# Patient Record
Sex: Female | Born: 1970 | Hispanic: Yes | Marital: Married | Smoking: Never smoker
Health system: Southern US, Community
[De-identification: ages and names within clinical notes are randomized; demographics above are authoritative.]

---

## 2017-08-16 ENCOUNTER — Other Ambulatory Visit: Payer: Self-pay

## 2017-08-16 ENCOUNTER — Ambulatory Visit (INDEPENDENT_AMBULATORY_CARE_PROVIDER_SITE_OTHER): Payer: PRIVATE HEALTH INSURANCE | Admitting: Osteopathic Medicine

## 2017-08-16 ENCOUNTER — Encounter: Payer: Self-pay | Admitting: Osteopathic Medicine

## 2017-08-16 VITALS — BP 120/68 | HR 60 | Temp 97.8°F | Resp 16 | Ht 64.0 in | Wt 204.4 lb

## 2017-08-16 DIAGNOSIS — Z0000 Encounter for general adult medical examination without abnormal findings: Secondary | ICD-10-CM | POA: Diagnosis not present

## 2017-08-16 NOTE — Progress Notes (Signed)
HPI: Teresa Patton is a 46 y.o. female with has no past medical history on file.  who presents to Primary Care at [facility] today, 08/16/17,  for chief complaint of:  Chief Complaint  Patient presents with  . Annual Exam    Pap 1 year ago  See below for review of preventive care  G4P4, no GHTN or GDM  Patient is accompanied by husband who assists with history-taking.   [place] utilized to assist translation: ID# [id number]   Past medical, surgical, social and family history reviewed:  There are no active problems to display for this patient.   History reviewed. No pertinent surgical history.  Social History   Tobacco Use  . Smoking status: Never Smoker  . Smokeless tobacco: Never Used  Substance Use Topics  . Alcohol use: Not on file    Family History  Problem Relation Age of Onset  . Cancer Mother   . Hypertension Father      Current medication list and allergy/intolerance information reviewed:    No current outpatient medications on file.   No current facility-administered medications for this visit.     No Known Allergies    Review of Systems:  Constitutional:  No  fever, no chills, No recent illness  HEENT: No  headache, no vision change,  Cardiac: No  chest pain  Respiratory:  No  shortness of breath.  Gastrointestinal: No  abdominal pain  Musculoskeletal: No new myalgia/arthralgia  Skin: No  Rash  Hem/Onc: No  easy bruising/bleedin  Neurologic: No  weakness, No  dizziness,   Psychiatric: No  concerns with depression, No  concerns with anxiety  Exam:  BP 120/68   Pulse 60   Temp 97.8 F (36.6 C)   Resp 16   Ht _0  (1.626 m)   Wt 204 lb 6.4 oz (92.7 kg)   LMP 07/27/2017   SpO2 99%   BMI 35.09 kg/m   Constitutional: VS see above. General Appearance: alert, well-developed, well-nourished, NAD  Eyes: Normal lids and conjunctive, non-icteric sclera  Ears, Nose, Mouth, Throat: MMM, Normal external inspection  ears/nares/mouth/lips/gums. TM normal bilaterally. Pharynx/tonsils no erythema, no exudate. Nasal mucosa normal.   Neck: No masses, trachea midline. No thyroid enlargement. No tenderness/mass appreciated. No lymphadenopathy  Respiratory: Normal respiratory effort. no wheeze, no rhonchi, no rales  Cardiovascular: S1/S2 normal, no murmur, no rub/gallop auscultated. RRR. No lower extremity edema.   Gastrointestinal: Nontender, no masses. No hepatomegaly, no splenomegaly. No hernia appreciated. Bowel sounds normal. Rectal exam deferred.   Musculoskeletal: Gait normal. No clubbing/cyanosis of digits.   Neurological: Normal balance/coordination. No tremor.   Skin: warm, dry, intact.   Psychiatric: Normal judgment/insight. Normal mood and affect. Oriented x3.  GYN: No lesions/ulcers to external genitalia, normal urethra, normal vaginal mucosa, physiologic discharge, cervix normal without lesions, uterus not enlarged or tender, adnexa no masses and nontender  BREAST: No rashes/skin changes, normal fibrous breast tissue, no masses or tenderness, normal nipple without discharge, normal axilla    ASSESSMENT/PLAN:   Annual physical exam - Plan: CBC, CMP14+EGFR, Lipid panel, Pap IG (Image Guided), MM DIGITAL SCREENING BILATERAL, CANCELED: CBC, CANCELED: CMP14+EGFR, CANCELED: Lipid panel  Routine general medical examination at a health care facility   [place] Updated 08/16/17   ANNUAL SCREENING/COUNSELING  Diet/Exercise - HEALTHY HABITS DISCUSSED TO DECREASE CV RISK Social History   Tobacco Use  Smoking Status Never Smoker  Smokeless Tobacco Never Used    Social History   Substance and Sexual Activity  Alcohol Use Not on file    Depression screen [facility] 2/9 08/16/2017  Decreased Interest 0  Down, Depressed, Hopeless 0  PHQ - 2 Score 0     Domestic violence concerns - no  HTN SCREENING - SEE [place]  Sexually active in the past year - Yes with  female.  Need/want STI testing today? - no  Concerns about libido or pain with sex? - no  INFECTIOUS DISEASE SCREENING  HIV - does not need  GC/CT - does not need  HepC - DOB [birth year] - does not need  TB - does not need  DISEASE SCREENING  Lipid - needs  DM2 - needs  Osteoporosis - women age 61+ - does not need  CANCER SCREENING  Cervical - needs  Breast - needs  Lung - does not need  Colon - does not need  ADULT VACCINATION  Influenza - annual vaccine recommended  Td - booster every 10 years   Zoster - Shingrix recommended 50+  PCV13 - was not indicated  PPSV23 - was not indicated  There is no immunization history on file for this patient.  OTHER  Fall - exercise and Vit D age 59+ - does not need       Patient Instructions       IF you received an x-ray today, you will receive an invoice from [facility]. Please contact [facility] at [phone] with questions or concerns regarding your invoice.   IF you received labwork today, you will receive an invoice from [place]. Please contact [facility] at [phone] with questions or concerns regarding your invoice.   Our billing staff will not be able to assist you with questions regarding bills from these companies.  You will be contacted with the lab results as soon as they are available. The fastest way to get your results is to activate your My Chart account. Instructions are located on the last page of this paperwork. If you have not heard from [place] regarding the results in 2 weeks, please contact this office.         Visit summary with medication list and pertinent instructions was printed for patient to review. All questions at time of visit were answered - patient instructed to contact office with any additional concerns. ER/RTC precautions were reviewed with the patient. Follow-up plan: Return in 1 year (on 08/16/2018) for annual check-up, sooner if needed or depending  on labs .    Please note: voice recognition software was used to produce this document, and typos may escape review. Please contact me for any needed clarifications.

## 2017-08-16 NOTE — Patient Instructions (Addendum)
   IF you received an x-ray today, you will receive an invoice from [facility]. Please contact  Radiology at [phone] with questions or concerns regarding your invoice.   IF you received labwork today, you will receive an invoice from [facility]. Please contact [facility] at [phone] with questions or concerns regarding your invoice.   Our billing staff will not be able to assist you with questions regarding bills from these companies.  You will be contacted with the lab results as soon as they are available. The fastest way to get your results is to activate your My Chart account. Instructions are located on the last page of this paperwork. If you have not heard from us regarding the results in 2 weeks, please contact this office.    Mantenimiento de la salud - Mujeres (Health Maintenance, Female) Un estilo de vida saludable y los cuidados preventivos pueden favorecer considerablemente a la salud y el bienestar. Pregunte a su mdico cul es el cronograma de exmenes peridicos apropiado para usted. Esta es una buena oportunidad para consultarlo sobre cmo prevenir enfermedades y mantenerse sano. Adems de los controles, hay muchas otras cosas que puede hacer usted mismo. Los expertos han realizado numerosas investigaciones sobre los cambios en el estilo de vida y las medidas de prevencin que, muy probablemente, lo ayudarn a mantenerse sano. Solicite a su mdico ms informacin. EL PESO Y LA DIETA Consuma una dieta saludable.  Asegrese de incluir muchas verduras, frutas, productos lcteos de bajo contenido de grasa y protenas magras.  No consuma muchos alimentos de alto contenido de grasas slidas, azcares agregados o sal.  Realice actividad fsica con regularidad. Esta es una de las prcticas ms importantes que puede hacer por su salud. ? La mayora de los adultos deben hacer ejercicio durante al menos 150minutos por semana. El ejercicio debe aumentar la  frecuencia cardaca y provocar la transpiracin (ejercicio de intensidad moderada). ? La mayora de los adultos tambin deben hacer ejercicios de elongacin al menos dos veces a la semana. Agregue esto al su plan de ejercicio de intensidad moderada. Mantenga un peso saludable.  El ndice de masa corporal (IMC) es una medida que puede utilizarse para identificar posibles problemas de peso. Proporciona una estimacin de la grasa corporal basndose en el peso y la altura. Su mdico puede ayudarle a determinar su IMC y a lograr o mantener un peso saludable.  Para las mujeres de 20aos o ms: ? Un IMC menor de 18,5 se considera bajo peso. ? Un IMC entre 18,5 y 24,9 es normal. ? Un IMC entre 25 y 29,9 se considera sobrepeso. ? Un IMC de 30 o ms se considera obesidad. Observe los niveles de colesterol y lpidos en la sangre.  Debe comenzar a realizarse anlisis de lpidos y colesterol en la sangre a los 20aos y luego repetirlos cada 5aos.  Es posible que necesite controlar los niveles de colesterol con mayor frecuencia si: ? Sus niveles de lpidos y colesterol son altos. ? Es mayor de 50aos. ? Presenta un alto riesgo de padecer enfermedades cardacas. DETECCIN DE CNCER Cncer de pulmn  Se recomienda realizar exmenes de deteccin de cncer de pulmn a personas adultas entre 55 y 80 aos que estn en riesgo de desarrollar cncer de pulmn por sus antecedentes de consumo de tabaco.  Se recomienda una tomografa computarizada de baja dosis de los pulmones todos los aos a las personas que: ? Fuman actualmente. ? Hayan dejado el hbito en algn momento en los ltimos 15aos. ?   Hayan fumado durante 30aos un paquete diario. Un paquete-ao equivale a fumar un promedio de un paquete de cigarrillos diario durante un ao.  Los exmenes de deteccin anuales deben continuar hasta que hayan pasado 15aos desde que dej de fumar.  Ya no debern realizarse si tiene un problema de salud que le  impida recibir tratamiento para el cncer de pulmn. Cncer de mama  Practique la autoconciencia de la mama. Esto significa reconocer la apariencia normal de sus mamas y cmo las siente.  Tambin significa realizar autoexmenes regulares de las mamas. Informe a su mdico sobre cualquier cambio, sin importar cun pequeo sea.  Si tiene entre 20 y 30 aos, un mdico debe realizarle un examen clnico de las mamas como parte del examen regular de salud, cada 1 a 3aos.  Si tiene 40aos o ms, debe realizarse un examen clnico de las mamas todos los aos. Tambin considere realizarse una radiografa de las mamas (mamografa) todos los aos.  Si tiene antecedentes familiares de cncer de mama, hable con su mdico para someterse a un estudio gentico.  Si tiene alto riesgo de padecer cncer de mama, hable con su mdico para someterse a una resonancia magntica y una mamografa todos los aos.  La evaluacin del gen del cncer de mama (BRCA) se recomienda a mujeres que tengan familiares con cnceres relacionados con el BRCA. Los cnceres relacionados con el BRCA incluyen los siguientes: ? Mama. ? Ovario. ? Trompas. ? Cnceres de peritoneo.  Los resultados de la evaluacin determinarn la necesidad de asesoramiento gentico y de anlisis de BRCA1 y BRCA2. Cncer de cuello del tero El mdico puede recomendarle que se haga pruebas peridicas de deteccin de cncer de los rganos de la pelvis (ovarios, tero y vagina). Estas pruebas incluyen un examen plvico, que abarca controlar si se produjeron cambios microscpicos en la superficie del cuello del tero (prueba de Papanicolaou). Pueden recomendarle que se haga estas pruebas cada 3aos, a partir de los 21aos.  A las mujeres que tienen entre 30 y 65aos, los mdicos pueden recomendarles que se sometan a exmenes plvicos y pruebas de Papanicolaou cada 3aos, o a la prueba de Papanicolaou y el examen plvico en combinacin con estudios de  deteccin del virus del papiloma humano (VPH) cada 5aos. Algunos tipos de VPH aumentan el riesgo de padecer cncer de cuello del tero. La prueba para la deteccin del VPH tambin puede realizarse a mujeres de cualquier edad cuyos resultados de la prueba de Papanicolaou no sean claros.  Es posible que otros mdicos no recomienden exmenes de deteccin a mujeres no embarazadas que se consideran sujetos de bajo riesgo de padecer cncer de pelvis y que no tienen sntomas. Pregntele al mdico si un examen plvico de deteccin es adecuado para usted.  Si ha recibido un tratamiento para el cncer cervical o una enfermedad que podra causar cncer, necesitar realizarse una prueba de Papanicolaou y controles durante al menos 20 aos de concluido el tratamiento. Si no se ha hecho el Papanicolaou con regularidad, debern volver a evaluarse los factores de riesgo (como tener un nuevo compaero sexual), para determinar si debe realizarse los estudios nuevamente. Algunas mujeres sufren problemas mdicos que aumentan la probabilidad de contraer cncer de cuello del tero. En estos casos, el mdico podr indicar que se realicen controles y pruebas de Papanicolaou con ms frecuencia. Cncer colorrectal  Este tipo de cncer puede detectarse y a menudo prevenirse.  Por lo general, los estudios de rutina se deben comenzar a hacer a partir de   los 50 aos y hasta los 75 aos.  Sin embargo, el mdico podr aconsejarle que lo haga antes, si tiene factores de riesgo para el cncer de colon.  Tambin puede recomendarle que use un kit de prueba para hallar sangre oculta en la materia fecal.  Es posible que se use una pequea cmara en el extremo de un tubo para examinar directamente el colon (sigmoidoscopia o colonoscopia) a fin de detectar formas tempranas de cncer colorrectal.  Los exmenes de rutina generalmente comienzan a los 50aos.  El examen directo del colon se debe repetir cada 5 a 10aos hasta los  75aos. Sin embargo, es posible que se realicen exmenes con mayor frecuencia, si se detectan formas tempranas de plipos precancerosos o pequeos bultos. Cncer de piel  Revise la piel de la cabeza a los pies con regularidad.  Informe a su mdico si aparecen nuevos lunares o los que tiene se modifican, especialmente en su forma y color.  Tambin notifique al mdico si tiene un lunar que es ms grande que el tamao de una goma de lpiz.  Siempre use pantalla solar. Aplique pantalla solar de manera libre y repetida a lo largo del da.  Protjase usando mangas y pantalones largos, un sombrero de ala ancha y gafas para el sol, siempre que se encuentre en el exterior. ENFERMEDADES CARDACAS, DIABETES E HIPERTENSIN ARTERIAL  La hipertensin arterial causa enfermedades cardacas y aumenta el riesgo de ictus. La hipertensin arterial es ms probable en los siguientes casos: ? Las personas que tienen la presin arterial en el extremo del rango normal (100-139/85-89 mm Hg). ? Las personas con sobrepeso u obesidad. ? Las personas afroamericanas.  Si usted tiene entre 18 y 39 aos, debe medirse la presin arterial cada 3 a 5 aos. Si usted tiene 40 aos o ms, debe medirse la presin arterial todos los aos. Debe medirse la presin arterial dos veces: una vez cuando est en un hospital o una clnica y la otra vez cuando est en otro sitio. Registre el promedio de las dos mediciones. Para controlar su presin arterial cuando no est en un hospital o una clnica, puede usar lo siguiente: ? Una mquina automtica para medir la presin arterial en una farmacia. ? Un monitor para medir la presin arterial en el hogar.  Si tiene entre 55 y 79 aos, consulte a su mdico si debe tomar aspirina para prevenir el ictus.  Realcese exmenes de deteccin de la diabetes con regularidad. Esto incluye la toma de una muestra de sangre para controlar el nivel de azcar en la sangre durante el ayuno. ? Si tiene un  peso normal y un bajo riesgo de padecer diabetes, realcese este anlisis cada tres aos despus de los 45aos. ? Si tiene sobrepeso y un alto riesgo de padecer diabetes, considere someterse a este anlisis antes o con mayor frecuencia. PREVENCIN DE INFECCIONES HepatitisB  Si tiene un riesgo ms alto de contraer hepatitis B, debe someterse a un examen de deteccin de este virus. Se considera que tiene un alto riesgo de contraer hepatitis B si: ? Naci en un pas donde la hepatitis B es frecuente. Pregntele a su mdico qu pases son considerados de alto riesgo. ? Sus padres nacieron en un pas de alto riesgo y usted no recibi una vacuna que lo proteja contra la hepatitis B (vacuna contra la hepatitis B). ? Tiene VIH o sida. ? Usa agujas para inyectarse drogas. ? Vive con alguien que tiene hepatitis B. ? Ha tenido sexo con alguien   que tiene hepatitis B. ? Recibe tratamiento de hemodilisis. ? Toma ciertos medicamentos para el cncer, trasplante de rganos y afecciones autoinmunitarias. Hepatitis C  Se recomienda un anlisis de sangre para: ? Todos los que nacieron entre 1945 y 1965. ? Todas las personas que tengan un riesgo de haber contrado hepatitis C. Enfermedades de transmisin sexual (ETS).  Debe realizarse pruebas de deteccin de enfermedades de transmisin sexual (ETS), incluidas gonorrea y clamidia si: ? Es sexualmente activo y es menor de 24aos. ? Es mayor de 24aos, y el mdico le informa que corre riesgo de tener este tipo de infecciones. ? La actividad sexual ha cambiado desde que le hicieron la ltima prueba de deteccin y tiene un riesgo mayor de tener clamidia o gonorrea. Pregntele al mdico si usted tiene riesgo.  Si no tiene el VIH, pero corre riesgo de infectarse por el virus, se recomienda tomar diariamente un medicamento recetado para evitar la infeccin. Esto se conoce como profilaxis previa a la exposicin. Se considera que est en riesgo si: ? Es activo  sexualmente y no usa preservativos habitualmente o no conoce el estado del VIH de sus parejas sexuales. ? Se inyecta drogas. ? Es activo sexualmente con una pareja que tiene VIH. Consulte a su mdico para saber si tiene un alto riesgo de infectarse por el VIH. Si opta por comenzar la profilaxis previa a la exposicin, primero debe realizarse anlisis de deteccin del VIH. Luego, le harn anlisis cada 3meses mientras est tomando los medicamentos para la profilaxis previa a la exposicin. EMBARAZO  Si es premenopusica y puede quedar embarazada, solicite a su mdico asesoramiento previo a la concepcin.  Si puede quedar embarazada, tome 400 a 800microgramos (mcg) de cido flico todos los das.  Si desea evitar el embarazo, hable con su mdico sobre el control de la natalidad (anticoncepcin). OSTEOPOROSIS Y MENOPAUSIA  La osteoporosis es una enfermedad en la que los huesos pierden los minerales y la fuerza por el avance de la edad. El resultado pueden ser fracturas graves en los huesos. El riesgo de osteoporosis puede identificarse con una prueba de densidad sea.  Si tiene 65aos o ms, o si est en riesgo de sufrir osteoporosis y fracturas, pregunte a su mdico si debe someterse a exmenes.  Consulte a su mdico si debe tomar un suplemento de calcio o de vitamina D para reducir el riesgo de osteoporosis.  La menopausia puede presentar ciertos sntomas fsicos y riesgos.  La terapia de reemplazo hormonal puede reducir algunos de estos sntomas y riesgos. Consulte a su mdico para saber si la terapia de reemplazo hormonal es conveniente para usted. INSTRUCCIONES PARA EL CUIDADO EN EL HOGAR  Realcese los estudios de rutina de la salud, dentales y de la vista.  Mantngase al da con las vacunas.  No consuma ningn producto que contenga tabaco, lo que incluye cigarrillos, tabaco de mascar o cigarrillos electrnicos.  Si est embarazada, no beba alcohol.  Si est amamantando,  reduzca el consumo de alcohol y la frecuencia con la que consume.  Si es mujer y no est embarazada limite el consumo de alcohol a no ms de 1 medida por da. Una medida equivale a 12onzas de cerveza, 5onzas de vino o 1onzas de bebidas alcohlicas de alta graduacin.  No consuma drogas.  No comparta agujas.  Solicite ayuda a su mdico si necesita apoyo o informacin para abandonar las drogas.  Informe a su mdico si a menudo se siente deprimido.  Notifique a su mdico si alguna vez   ha sido vctima de abuso o si no se siente seguro en su hogar. Esta informacin no tiene como fin reemplazar el consejo del mdico. Asegrese de hacerle al mdico cualquier pregunta que tenga. Document Released: 08/22/2011 Document Revised: 09/23/2014 Document Reviewed: 06/06/2015 Elsevier Interactive Patient Education  2018 [employer]  

## 2017-08-17 LAB — CMP14+EGFR
ALBUMIN: 4.3 g/dL (ref 3.5–5.5)
ALK PHOS: 55 IU/L (ref 39–117)
ALT: 12 IU/L (ref 0–32)
AST: 17 IU/L (ref 0–40)
Albumin/Globulin Ratio: 1.6 (ref 1.2–2.2)
BILIRUBIN TOTAL: 1.3 mg/dL — AB (ref 0.0–1.2)
BUN / CREAT RATIO: 20 (ref 9–23)
BUN: 11 mg/dL (ref 6–24)
CHLORIDE: 106 mmol/L (ref 96–106)
CO2: 19 mmol/L — AB (ref 20–29)
CREATININE: 0.55 mg/dL — AB (ref 0.57–1.00)
Calcium: 9.4 mg/dL (ref 8.7–10.2)
GFR calc Af Amer: 129 mL/min/{1.73_m2} (ref >59)
GFR calc non Af Amer: 112 mL/min/{1.73_m2} (ref >59)
GLUCOSE: 93 mg/dL (ref 65–99)
Globulin, Total: 2.7 g/dL (ref 1.5–4.5)
Potassium: 4.6 mmol/L (ref 3.5–5.2)
Sodium: 140 mmol/L (ref 134–144)
Total Protein: 7 g/dL (ref 6.0–8.5)

## 2017-08-17 LAB — CBC
HEMOGLOBIN: 13.3 g/dL (ref 11.1–15.9)
Hematocrit: 40.7 % (ref 34.0–46.6)
MCH: 29.3 pg (ref 26.6–33.0)
MCHC: 32.7 g/dL (ref 31.5–35.7)
MCV: 90 fL (ref 79–97)
Platelets: 279 10*3/uL (ref 150–379)
RBC: 4.54 x10E6/uL (ref 3.77–5.28)
RDW: 13.4 % (ref 12.3–15.4)
WBC: 7.5 10*3/uL (ref 3.4–10.8)

## 2017-08-17 LAB — LIPID PANEL
CHOLESTEROL TOTAL: 169 mg/dL (ref 100–199)
Chol/HDL Ratio: 3.2 ratio (ref 0.0–4.4)
HDL: 53 mg/dL (ref >39)
LDL Calculated: 103 mg/dL — ABNORMAL HIGH (ref 0–99)
TRIGLYCERIDES: 67 mg/dL (ref 0–149)
VLDL CHOLESTEROL CAL: 13 mg/dL (ref 5–40)

## 2017-08-19 LAB — PAP IG (IMAGE GUIDED): PAP Smear Comment: 0

## 2021-04-07 ENCOUNTER — Ambulatory Visit (HOSPITAL_COMMUNITY)
Admission: EM | Admit: 2021-04-07 | Discharge: 2021-04-07 | Disposition: A | Discharge status: Home / Self Care | Payer: BLUE CROSS/BLUE SHIELD

## 2021-04-07 NOTE — ED Triage Notes (Signed)
Pt called multiple times with no answer in front lobby.

## 2021-04-07 NOTE — ED Triage Notes (Signed)
Attempt to call for patient with no response.

## 2021-07-26 ENCOUNTER — Ambulatory Visit (INDEPENDENT_AMBULATORY_CARE_PROVIDER_SITE_OTHER): Payer: Self-pay | Admitting: Emergency Medicine

## 2021-07-26 ENCOUNTER — Other Ambulatory Visit: Payer: Self-pay

## 2021-07-26 ENCOUNTER — Encounter: Payer: Self-pay | Admitting: Emergency Medicine

## 2021-07-26 VITALS — BP 132/70 | HR 68 | Ht 64.0 in | Wt 209.0 lb

## 2021-07-26 DIAGNOSIS — Z7689 Persons encountering health services in other specified circumstances: Secondary | ICD-10-CM

## 2021-07-26 DIAGNOSIS — Z23 Encounter for immunization: Secondary | ICD-10-CM

## 2021-07-26 DIAGNOSIS — M25561 Pain in right knee: Secondary | ICD-10-CM

## 2021-07-26 DIAGNOSIS — M25562 Pain in left knee: Secondary | ICD-10-CM

## 2021-07-26 DIAGNOSIS — I10 Essential (primary) hypertension: Secondary | ICD-10-CM | POA: Diagnosis not present

## 2021-07-26 DIAGNOSIS — Z6835 Body mass index (BMI) 35.0-35.9, adult: Secondary | ICD-10-CM | POA: Diagnosis not present

## 2021-07-26 DIAGNOSIS — G8929 Other chronic pain: Secondary | ICD-10-CM

## 2021-07-26 MED ORDER — MELOXICAM 15 MG PO TABS: 15.0000 mg | ORAL_TABLET | Freq: Every day | ORAL | 3 refills | Status: DC

## 2021-07-26 NOTE — Patient Instructions (Signed)

## 2021-07-26 NOTE — Progress Notes (Signed)
Teresa Patton 50 y.o.   Chief Complaint  Patient presents with   New Patient (Initial Visit)    Discuss arm and knee pain    HISTORY OF PRESENT ILLNESS: This is a 50 y.o. female first visit to this office here to establish care with me. Mostly complaining of bilateral knee pains for several months. Has history of hypertension on lisinopril. No other complaints or medical concerns today. No other significant chronic medical history.  HPI   Prior to Admission medications   Medication Sig Start Date End Date Taking? Authorizing Provider  lisinopril (ZESTRIL) 20 MG tablet Take 20 mg by mouth daily. 07/01/21   [provider]    No Known Allergies  There are no problems to display for this patient.   History reviewed. No pertinent past medical history.  History reviewed. No pertinent surgical history.  Social History   Socioeconomic History   Marital status: Married    Spouse name: Not on file   Number of children: Not on file   Years of education: Not on file   Highest education level: Not on file  Occupational History   Not on file  Tobacco Use   Smoking status: Never   Smokeless tobacco: Never  Substance and Sexual Activity   Alcohol use: Not on file   Drug use: Not on file   Sexual activity: Not on file  Other Topics Concern   Not on file  Social History Narrative   Not on file   Social Determinants of Health   Financial Resource Strain: Not on file  Food Insecurity: Not on file  Transportation Needs: Not on file  Physical Activity: Not on file  Stress: Not on file  Social Connections: Not on file  Intimate Partner Violence: Not on file    Family History  Problem Relation Age of Onset   Cancer Mother    Hypertension Father      Review of Systems  Constitutional: Negative.  Negative for chills and fever.  HENT:  Negative for congestion and sore throat.   Respiratory: Negative.  Negative for cough and shortness of breath.    Cardiovascular: Negative.  Negative for chest pain and palpitations.  Gastrointestinal: Negative.  Negative for abdominal pain, diarrhea, nausea and vomiting.  Genitourinary: Negative.  Negative for dysuria and hematuria.  Musculoskeletal:  Positive for joint pain (Bilateral knee pain).  Skin: Negative.  Negative for rash.  Neurological:  Negative for dizziness and headaches.  All other systems reviewed and are negative. Today's Vitals   07/26/21 1517  BP: 132/70  Pulse: 68  SpO2: 99%  Weight: 209 lb (94.8 kg)  Height: 5\' 4"  (1.626 m)   Body mass index is 35.87 kg/m.   Physical Exam Vitals reviewed.  Constitutional:      Appearance: Normal appearance. She is obese.  HENT:     Head: Normocephalic.     Right Ear: Tympanic membrane, ear canal and external ear normal.     Left Ear: Tympanic membrane, ear canal and external ear normal.     Mouth/Throat:     Mouth: Mucous membranes are moist.     Pharynx: Oropharynx is clear.  Eyes:     Extraocular Movements: Extraocular movements intact.     Conjunctiva/sclera: Conjunctivae normal.     Pupils: Pupils are equal, round, and reactive to light.  Cardiovascular:     Rate and Rhythm: Normal rate and regular rhythm.     Pulses: Normal pulses.     Heart  sounds: Normal heart sounds.  Pulmonary:     Effort: Pulmonary effort is normal.     Breath sounds: Normal breath sounds.  Abdominal:     General: There is no distension.     Palpations: Abdomen is soft.     Tenderness: There is no abdominal tenderness.  Musculoskeletal:        General: Normal range of motion.     Cervical back: Normal range of motion and neck supple. No tenderness.     Comments: Bilateral knees: No erythema or swelling.  Full range of motion.  Stable in flexion and extension.  Lymphadenopathy:     Cervical: No cervical adenopathy.  Skin:    General: Skin is warm and dry.     Capillary Refill: Capillary refill takes less than 2 seconds.  Neurological:      General: No focal deficit present.     Mental Status: She is alert and oriented to person, place, and time.  Psychiatric:        Mood and Affect: Mood normal.        Behavior: Behavior normal.     ASSESSMENT & PLAN: Problem List Items Addressed This Visit       Cardiovascular and Mediastinum   Essential hypertension    Well-controlled hypertension.  Continue lisinopril 10 mg daily. BP Readings from Last 3 Encounters:  07/26/21 132/70  08/16/17 120/68  Dietary approaches to stop hypertension discussed with patient.       Relevant Medications   lisinopril (ZESTRIL) 20 MG tablet   Other Relevant Orders   Comprehensive metabolic panel   CBC with Differential/Platelet   Hemoglobin A1c   Lipid panel     Other   Chronic pain of both knees - Primary    Most likely secondary to osteoarthritis.  Advised to take meloxicam 15 mg daily as needed.      Relevant Medications   meloxicam (MOBIC) 15 MG tablet   Body mass index (BMI) of 35.0-35.9 in adult    Diet and nutrition discussed.  Advised to decrease amount of daily carbohydrate intake.      Other Visit Diagnoses     Need for influenza vaccination       Relevant Orders   Flu Vaccine QUAD 35mo+IM (Fluarix, Fluzone & Alfiuria Quad PF) (Completed)   Encounter to establish care          Patient Instructions  Mantenimiento de la salud en [place] Maintenance, Female Adoptar un estilo de vida saludable y recibir atencin preventiva son importantes para promover la salud y Musician. Consulte al mdico sobre: El esquema adecuado para hacerse pruebas y exmenes peridicos. Cosas que puede hacer por su cuenta para prevenir enfermedades y [place] sano. Qu debo saber sobre la dieta, el peso y el ejercicio? Consuma una dieta saludable  Consuma una dieta que incluya muchas verduras, frutas, productos lcteos con bajo contenido de [place] y Advertising account planner. No consuma muchos alimentos ricos en grasas slidas,  azcares agregados o sodio. Mantenga un peso saludable El ndice de masa muscular [facility]) se [place] para identificar problemas de [place]. Proporciona una estimacin de la grasa corporal basndose en el peso y la altura. Su mdico puede ayudarle a Radiation protection practitioner [place] y a Scientist, forensic o Theatre manager un peso saludable. Haga ejercicio con regularidad Haga ejercicio con regularidad. Esta es una de las prcticas ms importantes que puede hacer por su salud. La [employer] de los adultos deben seguir estas pautas: Realizar, al menos, 150 minutos de [place]  fsica por semana. El ejercicio debe aumentar la frecuencia cardaca y Nature conservation officer transpirar (ejercicio de intensidad moderada). Hacer ejercicios de fortalecimiento por lo [employer] por semana. Agregue esto a su plan de ejercicio de intensidad moderada. Pase menos tiempo sentada. Incluso la actividad fsica ligera puede ser beneficiosa. Controle sus niveles de colesterol y lpidos en la sangre Comience a realizarse anlisis de lpidos y Research officer, trade union en la sangre a los 60 aos y luego reptalos cada 5 aos. Hgase controlar los niveles de colesterol con mayor frecuencia si: Sus niveles de lpidos y colesterol son altos. Es mayor de 54 aos. Presenta un alto riesgo de padecer enfermedades cardacas. Qu debo saber sobre las pruebas de deteccin del cncer? Segn su historia clnica y sus antecedentes familiares, es posible que deba realizarse pruebas de deteccin del cncer en diferentes edades. Esto puede incluir pruebas de deteccin de lo siguiente: Cncer de mama. Cncer de cuello uterino. Cncer colorrectal. Cncer de piel. Cncer de pulmn. Qu debo saber sobre la enfermedad cardaca, la diabetes y la hipertensin arterial? Presin arterial y enfermedad cardaca La hipertensin arterial causa enfermedades cardacas y [place] el riesgo de accidente cerebrovascular. Es ms probable que esto se manifieste en las personas que tienen lecturas de presin arterial  alta o tienen sobrepeso. Hgase controlar la presin arterial: Cada 3 a 5 aos si tiene entre 18 y 58 aos. Todos los aos si es mayor de 40 aos. Diabetes Realcese exmenes de deteccin de la diabetes con regularidad. Este anlisis revisa el nivel de azcar en la sangre en [place]. Hgase las pruebas de deteccin: Cada tres aos despus de los 40 aos de edad si tiene un peso normal y un bajo riesgo de padecer diabetes. Con ms frecuencia y a partir de [place] edad inferior si tiene sobrepeso o un alto riesgo de padecer diabetes. Qu debo saber sobre la prevencin de infecciones? Hepatitis B Si tiene un riesgo ms alto de contraer hepatitis B, debe someterse a un examen de deteccin de este virus. Hable con el mdico para averiguar si tiene riesgo de contraer la infeccin por hepatitis B. Hepatitis C Se recomienda el anlisis a: [employer] 1945 y 1965. Todas las personas que tengan un riesgo de haber contrado hepatitis C. Enfermedades de transmisin sexual (ETS) Hgase las pruebas de Programme researcher, broadcasting/film/video de ITS, incluidas la gonorrea y la clamidia, si: Es sexualmente activa y es menor de 19 aos. Es mayor de 78 aos, y Investment banker, operational informa que corre riesgo de tener este tipo de infecciones. La actividad sexual ha cambiado desde que le hicieron la ltima prueba de deteccin y tiene un riesgo mayor de Best boy clamidia o Radio broadcast assistant. Pregntele al mdico si usted tiene riesgo. Pregntele al mdico si usted tiene un alto riesgo de Museum/gallery curator VIH. El mdico tambin puede recomendarle un medicamento recetado para ayudar a evitar la infeccin por el VIH. Si elige tomar medicamentos para prevenir el VIH, primero debe [employer] de deteccin del VIH. Luego debe hacerse anlisis cada 3 meses mientras est tomando los medicamentos. Embarazo Si est por dejar de Librarian, academic (fase premenopusica) y usted puede quedar [place], busque asesoramiento antes de [place]. Tome de 400 a 800  microgramos (mcg) de cido [employer] si [place]. Pida mtodos de control de la natalidad (anticonceptivos) si desea evitar un embarazo no deseado. Osteoporosis y [place] La osteoporosis es una enfermedad en la que los huesos pierden los minerales y la fuerza por el avance de la edad.  El resultado pueden ser fracturas en los [place]. Si tiene 41 aos o ms, o si est en riesgo de sufrir osteoporosis y fracturas, pregunte a su mdico si debe: Hacerse pruebas de deteccin de prdida sea. Tomar un suplemento de calcio o de vitamina D para reducir el riesgo de fracturas. Recibir terapia de reemplazo hormonal (TRH) para tratar los sntomas de la menopausia. Siga estas indicaciones en su casa: Consumo de alcohol No beba alcohol si: Su mdico le indica no hacerlo. Est embarazada, puede estar embarazada o est tratando de [place]. Si bebe alcohol: Limite la cantidad que bebe a lo siguiente: De 0 a 1 bebida por da. Sepa cunta cantidad de alcohol hay en las bebidas que toma. En los [place], una medida equivale a una botella de cerveza de 12 oz (355 ml), un vaso de vino de 5 oz (148 ml) o un vaso de una bebida alcohlica de alta graduacin de 1 oz (44 ml). Estilo de vida No consuma ningn producto que contenga nicotina o tabaco. Estos productos incluyen cigarrillos, tabaco para Higher education careers adviser y aparatos de vapeo, como los Psychologist, sport and exercise. Si necesita ayuda para dejar de consumir estos productos, consulte al mdico. No consuma drogas. No comparta agujas. Solicite ayuda a su mdico si necesita apoyo o informacin para abandonar las drogas. Indicaciones generales Realcese los estudios de rutina de la salud, dentales y de Public librarian. [place]. Infrmele a su mdico si: Se siente deprimida con frecuencia. Alguna vez ha sido vctima de [place] o no se siente seguro en su casa. Resumen Adoptar un estilo de vida saludable y recibir  atencin preventiva son importantes para promover la salud y Musician. Siga las instrucciones del mdico acerca de una dieta saludable, el ejercicio y la realizacin de pruebas o exmenes para Engineer, building services. Siga las instrucciones del mdico con respecto al control del colesterol y la presin arterial. Esta informacin no tiene Marine scientist el consejo del mdico. Asegrese de hacerle al mdico cualquier pregunta que tenga. Document Revised: 02/08/2021 Document Reviewed: 02/08/2021 Elsevier Patient Education  2022 [place], MD [facility]

## 2021-07-26 NOTE — Assessment & Plan Note (Signed)
Diet and nutrition discussed.  Advised to decrease amount of daily carbohydrate intake. 

## 2021-07-26 NOTE — Assessment & Plan Note (Signed)
Most likely secondary to osteoarthritis.  Advised to take meloxicam 15 mg daily as needed.

## 2021-07-26 NOTE — Assessment & Plan Note (Signed)
Well-controlled hypertension.  Continue lisinopril 10 mg daily. BP Readings from Last 3 Encounters:  07/26/21 132/70  08/16/17 120/68  Dietary approaches to stop hypertension discussed with patient.

## 2021-07-27 LAB — CBC WITH DIFFERENTIAL/PLATELET
Basophils Absolute: 0.1 10*3/uL (ref 0.0–0.1)
Basophils Relative: 0.9 % (ref 0.0–3.0)
Eosinophils Absolute: 0.3 10*3/uL (ref 0.0–0.7)
Eosinophils Relative: 3.3 % (ref 0.0–5.0)
HCT: 34.1 % — ABNORMAL LOW (ref 36.0–46.0)
Hemoglobin: 11.1 g/dL — ABNORMAL LOW (ref 12.0–15.0)
Lymphocytes Relative: 25.7 % (ref 12.0–46.0)
Lymphs Abs: 2.2 10*3/uL (ref 0.7–4.0)
MCHC: 32.6 g/dL (ref 30.0–36.0)
MCV: 84.9 fl (ref 78.0–100.0)
Monocytes Absolute: 0.5 10*3/uL (ref 0.1–1.0)
Monocytes Relative: 5.8 % (ref 3.0–12.0)
Neutro Abs: 5.4 10*3/uL (ref 1.4–7.7)
Neutrophils Relative %: 64.3 % (ref 43.0–77.0)
Platelets: 321 10*3/uL (ref 150.0–400.0)
RBC: 4.01 Mil/uL (ref 3.87–5.11)
RDW: 14.9 % (ref 11.5–15.5)
WBC: 8.4 10*3/uL (ref 4.0–10.5)

## 2021-07-27 LAB — LIPID PANEL
Cholesterol: 173 mg/dL (ref 0–200)
HDL: 55.9 mg/dL (ref >39.00)
LDL Cholesterol: 98 mg/dL (ref 0–99)
NonHDL: 117.59
Total CHOL/HDL Ratio: 3
Triglycerides: 97 mg/dL (ref 0.0–149.0)
VLDL: 19.4 mg/dL (ref 0.0–40.0)

## 2021-07-27 LAB — COMPREHENSIVE METABOLIC PANEL
ALT: 14 U/L (ref 0–35)
AST: 18 U/L (ref 0–37)
Albumin: 4.3 g/dL (ref 3.5–5.2)
Alkaline Phosphatase: 49 U/L (ref 39–117)
BUN: 17 mg/dL (ref 6–23)
CO2: 26 mEq/L (ref 19–32)
Calcium: 9.1 mg/dL (ref 8.4–10.5)
Chloride: 104 mEq/L (ref 96–112)
Creatinine, Ser: 0.57 mg/dL (ref 0.40–1.20)
GFR: 105.48 mL/min (ref >60.00)
Glucose, Bld: 92 mg/dL (ref 70–99)
Potassium: 4.3 mEq/L (ref 3.5–5.1)
Sodium: 136 mEq/L (ref 135–145)
Total Bilirubin: 1.1 mg/dL (ref 0.2–1.2)
Total Protein: 7.1 g/dL (ref 6.0–8.3)

## 2021-07-27 LAB — HEMOGLOBIN A1C: Hgb A1c MFr Bld: 6 % (ref 4.6–6.5)

## 2021-07-29 ENCOUNTER — Other Ambulatory Visit: Payer: Self-pay | Admitting: Emergency Medicine

## 2021-07-29 DIAGNOSIS — D649 Anemia, unspecified: Secondary | ICD-10-CM

## 2021-07-29 DIAGNOSIS — Z1211 Encounter for screening for malignant neoplasm of colon: Secondary | ICD-10-CM

## 2021-08-07 ENCOUNTER — Encounter: Payer: Self-pay | Admitting: Gastroenterology

## 2021-08-24 ENCOUNTER — Other Ambulatory Visit (INDEPENDENT_AMBULATORY_CARE_PROVIDER_SITE_OTHER): Payer: Self-pay

## 2021-08-24 ENCOUNTER — Ambulatory Visit: Payer: PRIVATE HEALTH INSURANCE | Admitting: Gastroenterology

## 2021-08-24 ENCOUNTER — Encounter: Payer: Self-pay | Admitting: Emergency Medicine

## 2021-08-24 ENCOUNTER — Other Ambulatory Visit: Payer: Self-pay

## 2021-08-24 ENCOUNTER — Telehealth: Payer: Self-pay | Admitting: Emergency Medicine

## 2021-08-24 DIAGNOSIS — D649 Anemia, unspecified: Secondary | ICD-10-CM | POA: Diagnosis not present

## 2021-08-24 LAB — FERRITIN: Ferritin: 6.3 ng/mL — ABNORMAL LOW (ref 10.0–291.0)

## 2021-08-24 LAB — VITAMIN B12: Vitamin B-12: 1342 pg/mL — ABNORMAL HIGH (ref 211–911)

## 2021-08-24 LAB — FOLATE: Folate: 18.1 ng/mL (ref >5.9)

## 2021-08-24 NOTE — Telephone Encounter (Signed)
Patient had  a referral to [facility] and they do not accept her insurance. Patient is asking if she can have a referral to [facility] who accepts [facility].

## 2021-08-24 NOTE — Telephone Encounter (Signed)
Her daughter speaks english you can contact her at [phone] her name is Arleth.

## 2021-08-25 LAB — IRON AND TIBC
Iron Saturation: 13 % — ABNORMAL LOW (ref 15–55)
Iron: 57 ug/dL (ref 27–159)
Total Iron Binding Capacity: 439 ug/dL (ref 250–450)
UIBC: 382 ug/dL (ref 131–425)

## 2021-08-30 NOTE — Telephone Encounter (Signed)
° °  Left message to get the name of specific provider/ Laurette Schimke

## 2021-11-08 ENCOUNTER — Ambulatory Visit (INDEPENDENT_AMBULATORY_CARE_PROVIDER_SITE_OTHER): Payer: Self-pay

## 2021-11-08 ENCOUNTER — Ambulatory Visit (INDEPENDENT_AMBULATORY_CARE_PROVIDER_SITE_OTHER): Payer: Self-pay | Admitting: Emergency Medicine

## 2021-11-08 ENCOUNTER — Encounter: Payer: Self-pay | Admitting: Emergency Medicine

## 2021-11-08 ENCOUNTER — Other Ambulatory Visit: Payer: Self-pay

## 2021-11-08 VITALS — BP 140/82 | HR 66 | Temp 98.4°F | Ht 64.0 in | Wt 205.0 lb

## 2021-11-08 DIAGNOSIS — M25562 Pain in left knee: Secondary | ICD-10-CM

## 2021-11-08 DIAGNOSIS — Z1211 Encounter for screening for malignant neoplasm of colon: Secondary | ICD-10-CM

## 2021-11-08 DIAGNOSIS — I10 Essential (primary) hypertension: Secondary | ICD-10-CM

## 2021-11-08 DIAGNOSIS — D509 Iron deficiency anemia, unspecified: Secondary | ICD-10-CM

## 2021-11-08 DIAGNOSIS — Z1231 Encounter for screening mammogram for malignant neoplasm of breast: Secondary | ICD-10-CM

## 2021-11-08 DIAGNOSIS — G8929 Other chronic pain: Secondary | ICD-10-CM

## 2021-11-08 DIAGNOSIS — M25561 Pain in right knee: Secondary | ICD-10-CM

## 2021-11-08 DIAGNOSIS — N951 Menopausal and female climacteric states: Secondary | ICD-10-CM

## 2021-11-08 MED ORDER — LOSARTAN POTASSIUM-HCTZ 50-12.5 MG PO TABS: 1.0000 | ORAL_TABLET | Freq: Every day | ORAL | 3 refills | Status: DC

## 2021-11-08 MED ORDER — MELOXICAM 15 MG PO TABS: 15.0000 mg | ORAL_TABLET | Freq: Every day | ORAL | 3 refills | Status: DC

## 2021-11-08 NOTE — Assessment & Plan Note (Signed)
Needs colonoscopy.  Referred to GI.

## 2021-11-08 NOTE — Progress Notes (Signed)
Teresa Patton 51 y.o.   Chief Complaint  Patient presents with   Follow-up    3 month, knee pain, discuss menopause. Would like colonoscopy but needs somewhere that takes her insurance   Medication Refill    lisinopril    HISTORY OF PRESENT ILLNESS: This is a 51 y.o. female here for follow-up of several chronic problems: 1.  Chronic pain to both knees.  Meloxicam works.  Needs refill. Needs referral to sports medicine 2.  Hypertension.  Was started on lisinopril 20 mg.  Blood pressure readings at home and work elevated.  Will change dose, 3.  Perimenopausal.  Needs GYN referral 4.  Needs breast cancer screening with mammogram 5.  Needs no referral for colonoscopy.  Told by [facility] they do not take her insurance.  Told her she needed to go to the [facility] system. No other complaints or medical concerns today. BP Readings from Last 3 Encounters:  11/08/21 140/82  07/26/21 132/70  08/16/17 120/68     Medication Refill Pertinent negatives include no abdominal pain, chest pain, chills, congestion, coughing, fever, headaches, nausea, rash, sore throat or vomiting.    Prior to Admission medications   Medication Sig Start Date End Date Taking? Authorizing Provider  lisinopril (ZESTRIL) 20 MG tablet Take 20 mg by mouth daily. 07/01/21  Yes [provider]  meloxicam (MOBIC) 15 MG tablet Take 1 tablet (15 mg total) by mouth daily. 07/26/21  Yes Georgina Quint, MD    No Known Allergies  Patient Active Problem List   Diagnosis Date Noted   Chronic pain of both knees 07/26/2021   Essential hypertension 07/26/2021   Body mass index (BMI) of 35.0-35.9 in adult 07/26/2021    No past medical history on file.  No past surgical history on file.  Social History   Socioeconomic History   Marital status: Married    Spouse name: Not on file   Number of children: Not on file   Years of education: Not on file   Highest education level: Not on file   Occupational History   Not on file  Tobacco Use   Smoking status: Never   Smokeless tobacco: Never  Substance and Sexual Activity   Alcohol use: Not on file   Drug use: Not on file   Sexual activity: Not on file  Other Topics Concern   Not on file  Social History Narrative   Not on file   Social Determinants of Health   Financial Resource Strain: Not on file  Food Insecurity: Not on file  Transportation Needs: Not on file  Physical Activity: Not on file  Stress: Not on file  Social Connections: Not on file  Intimate Partner Violence: Not on file    Family History  Problem Relation Age of Onset   Cancer Mother    Hypertension Father      Review of Systems  Constitutional: Negative.  Negative for chills and fever.  HENT: Negative.  Negative for congestion and sore throat.   Respiratory: Negative.  Negative for cough and shortness of breath.   Cardiovascular: Negative.  Negative for chest pain and palpitations.  Gastrointestinal:  Negative for abdominal pain, diarrhea, nausea and vomiting.  Musculoskeletal:  Positive for joint pain (Both knees).  Skin: Negative.  Negative for rash.  Neurological: Negative.  Negative for dizziness and headaches.  All other systems reviewed and are negative.  Today's Vitals   11/08/21 1521  BP: 140/82  Pulse: 66  Temp: 98.4  F (36.9 C)  TempSrc: Oral  SpO2: 99%  Weight: 205 lb (93 kg)  Height: 5\' 4"  (1.626 m)   Body mass index is 35.19 kg/m.  Physical Exam Vitals reviewed.  Constitutional:      Appearance: Normal appearance.  HENT:     Head: Normocephalic.  Eyes:     Extraocular Movements: Extraocular movements intact.     Pupils: Pupils are equal, round, and reactive to light.  Cardiovascular:     Rate and Rhythm: Normal rate and regular rhythm.     Pulses: Normal pulses.     Heart sounds: Normal heart sounds.  Pulmonary:     Effort: Pulmonary effort is normal.     Breath sounds: Normal breath sounds.   Musculoskeletal:     Cervical back: No tenderness.     Comments: Both knees: No erythema or ecchymosis.  Full range of motion.  No significant swelling or tenderness.  Lymphadenopathy:     Cervical: No cervical adenopathy.  Skin:    General: Skin is warm and dry.  Neurological:     General: No focal deficit present.     Mental Status: She is alert and oriented to person, place, and time.  Psychiatric:        Mood and Affect: Mood normal.        Behavior: Behavior normal.    DG Knee 1-2 Views Left  Result Date: 11/08/2021 CLINICAL DATA:  Bilateral knee pain for 8 months EXAM: LEFT KNEE - 1-2 VIEW; RIGHT KNEE - 1-2 VIEW COMPARISON:  None. FINDINGS: No acute fracture or malalignment of the bilateral knees. Mild tricompartmental joint space narrowing of both knees. Trace knee joint effusions bilaterally, nonspecific. No focal soft tissue swelling. IMPRESSION: Mild tricompartmental joint space narrowing of both knees. Electronically Signed   By: Duanne Guess D.O.   On: 11/08/2021 16:11   DG Knee 1-2 Views Right  Result Date: 11/08/2021 CLINICAL DATA:  Bilateral knee pain for 8 months EXAM: LEFT KNEE - 1-2 VIEW; RIGHT KNEE - 1-2 VIEW COMPARISON:  None. FINDINGS: No acute fracture or malalignment of the bilateral knees. Mild tricompartmental joint space narrowing of both knees. Trace knee joint effusions bilaterally, nonspecific. No focal soft tissue swelling. IMPRESSION: Mild tricompartmental joint space narrowing of both knees. Electronically Signed   By: Duanne Guess D.O.   On: 11/08/2021 16:11    ASSESSMENT & PLAN: A total of 50 minutes was spent with the patient and counseling/coordination of care regarding preparing for this visit, review of most recent office visit notes, review of most recent blood work results that show iron deficiency anemia, need for GI evaluation and colonoscopy, uncontrolled hypertension and cardiovascular risks associated with this condition, review of all  medications and changes made, review of differential diagnosis of chronic knee pain, review of x-ray report, need for orthopedic evaluation, need for GYN evaluation on perimenopausal symptoms, need for screening mammogram, prognosis, documentation and need for follow-up.  Problem List Items Addressed This Visit       Cardiovascular and Mediastinum   Essential hypertension - Primary    Elevated blood pressure readings at home and in the office. Stop lisinopril and start losartan-HCTZ 50-12.5 mg daily. Dietary approaches to stop hypertension discussed.      Relevant Medications   losartan-hydrochlorothiazide (HYZAAR) 50-12.5 MG tablet     Other   Chronic pain of both knees    Most likely osteoarthritis.  Meloxicam helping. X-rays done today.  Will refer to sports medicine for further evaluation.  Relevant Medications   meloxicam (MOBIC) 15 MG tablet   Other Relevant Orders   DG Knee 1-2 Views Left (Completed)   DG Knee 1-2 Views Right (Completed)   Ambulatory referral to Sports Medicine   Iron deficiency anemia    Needs colonoscopy.  Referred to GI.      Perimenopausal    Mild symptoms.  Does not desire HRT.  Needs gynecology evaluation.  Referral placed today.      Relevant Orders   Ambulatory referral to Gynecology   Other Visit Diagnoses     Encounter for screening mammogram for malignant neoplasm of breast       Relevant Orders   MM Digital Screening   Colon cancer screening       Relevant Orders   Ambulatory referral to Gastroenterology      Patient Instructions  Mantenimiento de la salud en las mujeres Health Maintenance, Female Adoptar un estilo de vida saludable y recibir atencin preventiva son importantes para promover la salud y Counsellorel bienestar. Consulte al mdico sobre: El esquema adecuado para hacerse pruebas y exmenes peridicos. Cosas que puede hacer por su cuenta para prevenir enfermedades y Jeffersonvillemantenerse sano. Qu debo saber sobre la dieta, el  peso y el ejercicio? Consuma una dieta saludable  Consuma una dieta que incluya muchas verduras, frutas, productos lcteos con bajo contenido de [place]gr[place] y Associate Professorprotenas magras. No consuma muchos alimentos ricos en grasas slidas, azcares agregados o sodio. Mantenga un peso saludable El ndice de masa muscular [facility]) se [place] Islandsutiliza para identificar problemas de Meridianpeso. Proporciona una estimacin de la grasa corporal basndose en el peso y la altura. Su mdico puede ayudarle a Engineer, sitedeterminar su IMC y a Personnel officerlograr o Pharmacologistmantener un peso saludable. Haga ejercicio con regularidad Haga ejercicio con regularidad. Esta es una de las prcticas ms importantes que puede hacer por su salud. La Harley-Davidsonmayora de los adultos deben seguir estas pautas: Education officer, environmentalealizar, al menos, 150 minutos de actividad fsica por semana. El ejercicio debe aumentar la frecuencia cardaca y Media plannerhacerlo transpirar (ejercicio de intensidad moderada). Hacer ejercicios de fortalecimiento por lo Rite Aidmenos dos veces por semana. Agregue esto a su plan de ejercicio de intensidad moderada. Pase menos tiempo sentada. Incluso la actividad fsica ligera puede ser beneficiosa. Controle sus niveles de colesterol y lpidos en la sangre Comience a realizarse anlisis de lpidos y Oncologistcolesterol en la sangre a los 20 aos y luego reptalos cada 5 aos. Hgase controlar los niveles de colesterol con mayor frecuencia si: Sus niveles de lpidos y colesterol son altos. Es mayor de 40 aos. Presenta un alto riesgo de padecer enfermedades cardacas. Qu debo saber sobre las pruebas de deteccin del cncer? Segn su historia clnica y sus antecedentes familiares, es posible que deba realizarse pruebas de deteccin del cncer en diferentes edades. Esto puede incluir pruebas de deteccin de lo siguiente: Cncer de mama. Cncer de cuello uterino. Cncer colorrectal. Cncer de piel. Cncer de pulmn. Qu debo saber sobre la enfermedad cardaca, la diabetes y la hipertensin arterial? Presin  arterial y enfermedad cardaca La hipertensin arterial causa enfermedades cardacas y Lesothoaumenta el riesgo de accidente cerebrovascular. Es ms probable que esto se manifieste en las personas que tienen lecturas de presin arterial alta o tienen sobrepeso. Hgase controlar la presin arterial: Cada 3 a 5 aos si tiene entre 18 y 1639 aos. Todos los aos si es mayor de 40 aos. Diabetes Realcese exmenes de deteccin de la diabetes con regularidad. Este anlisis revisa el nivel de azcar en la sangre en [place]unas.  Hgase las pruebas de deteccin: Cada tres aos despus de los 40 aos de edad si tiene un peso normal y un bajo riesgo de padecer diabetes. Con ms frecuencia y a partir de [place] edad inferior si tiene sobrepeso o un alto riesgo de padecer diabetes. Qu debo saber sobre la prevencin de infecciones? Hepatitis B Si tiene un riesgo ms alto de contraer hepatitis B, debe someterse a un examen de deteccin de este virus. Hable con el mdico para averiguar si tiene riesgo de contraer la infeccin por hepatitis B. Hepatitis C Se recomienda el anlisis a: [employer] 1945 y 1965. Todas las personas que tengan un riesgo de haber contrado hepatitis C. Enfermedades de transmisin sexual (ETS) Hgase las pruebas de Airline pilot de ITS, incluidas la gonorrea y la clamidia, si: Es sexualmente activa y es menor de [address]. Es mayor de [address], y Public affairs consultant informa que corre riesgo de tener este tipo de infecciones. La actividad sexual ha cambiado desde que le hicieron la ltima prueba de deteccin y tiene un riesgo mayor de Warehouse manager clamidia o Copy. Pregntele al mdico si usted tiene riesgo. Pregntele al mdico si usted tiene un alto riesgo de Primary school teacher VIH. El mdico tambin puede recomendarle un medicamento recetado para ayudar a evitar la infeccin por el VIH. Si elige tomar medicamentos para prevenir el VIH, primero debe [employer] de deteccin del VIH. Luego debe hacerse  anlisis cada 3 meses mientras est tomando los medicamentos. Embarazo Si est por dejar de Armed forces training and education officer (fase premenopusica) y usted puede quedar [place], busque asesoramiento antes de [place]. [place] de 400 a 800 microgramos (mcg) de cido [employer] si [place]. Pida mtodos de control de la natalidad (anticonceptivos) si desea evitar un embarazo no deseado. Osteoporosis y [place] La osteoporosis es una enfermedad en la que los huesos pierden los minerales y la fuerza por el avance de la edad. El resultado pueden ser fracturas en los [place]. Si tiene 65 aos o ms, o si est en riesgo de sufrir osteoporosis y fracturas, pregunte a su mdico si debe: Hacerse pruebas de deteccin de prdida sea. Tomar un suplemento de calcio o de vitamina D para reducir el riesgo de fracturas. Recibir terapia de reemplazo hormonal (TRH) para tratar los sntomas de la menopausia. Siga estas indicaciones en su casa: Consumo de alcohol No beba alcohol si: Su mdico le indica no hacerlo. Est embarazada, puede estar embarazada o est tratando de [place]. Si bebe alcohol: Limite la cantidad que bebe a lo siguiente: De 0 a 1 bebida por da. Sepa cunta cantidad de alcohol hay en las bebidas que toma. En los [address], una medida equivale a una botella de cerveza de 12 oz (355 ml), un vaso de vino de 5 oz (148 ml) o un vaso de una bebida alcohlica de alta graduacin de 1 oz (44 ml). Estilo de vida No consuma ningn producto que contenga nicotina o tabaco. Estos productos incluyen cigarrillos, tabaco para Theatre manager y aparatos de vapeo, como los Administrator, Civil Service. Si necesita ayuda para dejar de consumir estos productos, consulte al mdico. No consuma drogas. No comparta agujas. Solicite ayuda a su mdico si necesita apoyo o informacin para abandonar las drogas. Indicaciones generales Realcese los estudios de rutina de [address], dentales y de Research scientist (medical). Mantngase al da con las vacunas. Infrmele a su mdico si: Se siente deprimida con frecuencia. Alguna vez ha sido vctima de [place] o no se siente  seguro en su casa. Resumen Adoptar un estilo de vida saludable y recibir atencin preventiva son importantes para promover la salud y Counsellorel bienestar. Siga las instrucciones del mdico acerca de una dieta saludable, el ejercicio y la realizacin de pruebas o exmenes para Hotel managerdetectar enfermedades. Siga las instrucciones del mdico con respecto al control del colesterol y la presin arterial. Esta informacin no tiene Theme park managercomo fin reemplazar el consejo del mdico. Asegrese de hacerle al mdico cualquier pregunta que tenga. Document Revised: 02/08/2021 Document Reviewed: 02/08/2021 Elsevier Patient Education  2022 Elsevier Inc.    Edwina BarthMiguel Nadege Carriger, MD [facility]

## 2021-11-08 NOTE — Assessment & Plan Note (Signed)
Most likely osteoarthritis.  Meloxicam helping. X-rays done today.  Will refer to sports medicine for further evaluation.

## 2021-11-08 NOTE — Assessment & Plan Note (Signed)
Mild symptoms.  Does not desire HRT.  Needs gynecology evaluation.  Referral placed today.

## 2021-11-08 NOTE — Assessment & Plan Note (Signed)
Elevated blood pressure readings at home and in the office. Stop lisinopril and start losartan-HCTZ 50-12.5 mg daily. Dietary approaches to stop hypertension discussed.

## 2021-11-08 NOTE — Patient Instructions (Signed)

## 2021-12-01 LAB — HM MAMMOGRAPHY

## 2021-12-04 ENCOUNTER — Telehealth: Payer: Self-pay | Admitting: Emergency Medicine

## 2021-12-04 DIAGNOSIS — G8929 Other chronic pain: Secondary | ICD-10-CM

## 2021-12-04 NOTE — Telephone Encounter (Signed)
Referral to sports medicine placed

## 2021-12-04 NOTE — Telephone Encounter (Signed)
Patient daughter calling in ? ?Requesting provider submit new referral to sports medicine ? ?Patient request to not schedule appt w/ original referral & it is not closed ? ?Patient daughter says she does not speak english very well & would prefer that she be contacted ? ?Please let her know when referral has been submitted [phone] ? ? ?

## 2021-12-04 NOTE — Telephone Encounter (Signed)
Please place referral to sports medicine again.  Thanks.

## 2021-12-12 NOTE — Progress Notes (Deleted)
? ?   Aleen Sells D.Judd Gaudier ?[facility] ?[address] [place] [address] ?Phone: [phone] ?  ?Assessment and Plan:   ?  ?There are no diagnoses linked to this encounter.  ?*** ?  ?Pertinent previous records reviewed include *** ?  ?Follow Up: ***  ? ?  ?Subjective:   ?I, Jerene Canny, am serving as a Neurosurgeon for [employer] ? ?Chief Complaint: bilateral knee pain  ? ?HPI:  ? ?12/13/2021  ?Patient is a 51 year old female complaining of bilateral knee pain. Patient states ? ?Relevant Historical Information: *** ? ?Additional pertinent review of systems negative. ? ? ?Current Outpatient Medications:  ?  losartan-hydrochlorothiazide (HYZAAR) 50-12.5 MG tablet, Take 1 tablet by mouth daily., Disp: 90 tablet, Rfl: 3 ?  meloxicam (MOBIC) 15 MG tablet, Take 1 tablet (15 mg total) by mouth daily., Disp: 30 tablet, Rfl: 3  ? ?Objective:   ?  ?There were no vitals filed for this visit.  ?  ?There is no height or weight on file to calculate BMI.  ?  ?Physical Exam:   ? ?*** ? ? ?Electronically signed by:  ?Aleen Sells D.Judd Gaudier ?[facility] ?11:46 AM 12/12/21 ?

## 2021-12-13 ENCOUNTER — Telehealth: Payer: Self-pay | Admitting: Emergency Medicine

## 2021-12-13 ENCOUNTER — Encounter: Payer: Self-pay | Admitting: *Deleted

## 2021-12-13 ENCOUNTER — Ambulatory Visit: Payer: BLUE CROSS/BLUE SHIELD | Admitting: Sports Medicine

## 2021-12-13 NOTE — Telephone Encounter (Signed)
PT visits today in regards to needing a medication refill. PT would like a refill on their Meloxicam sent in to the [facility] if possible! Please call PT's daughter when prescription is filled!  ? ?CB: [phone] ?

## 2021-12-13 NOTE — Telephone Encounter (Signed)
Called patient and left message in reference to patient medication request. Patient daughter can call [facility] and get the original prescription transferred to the [facility] pharmacy they prefer.  ?

## 2021-12-20 LAB — HM MAMMOGRAPHY

## 2022-03-07 ENCOUNTER — Institutional Professional Consult (permissible substitution) (HOSPITAL_BASED_OUTPATIENT_CLINIC_OR_DEPARTMENT_OTHER): Payer: BLUE CROSS/BLUE SHIELD | Admitting: Obstetrics & Gynecology

## 2022-04-23 ENCOUNTER — Other Ambulatory Visit: Payer: Self-pay | Admitting: Emergency Medicine

## 2022-04-23 DIAGNOSIS — G8929 Other chronic pain: Secondary | ICD-10-CM

## 2022-04-28 ENCOUNTER — Other Ambulatory Visit: Payer: Self-pay | Admitting: Emergency Medicine

## 2022-04-28 DIAGNOSIS — G8929 Other chronic pain: Secondary | ICD-10-CM

## 2022-10-24 ENCOUNTER — Other Ambulatory Visit: Payer: Self-pay | Admitting: Emergency Medicine

## 2022-10-24 DIAGNOSIS — I10 Essential (primary) hypertension: Secondary | ICD-10-CM

## 2022-12-26 IMAGING — DX DG KNEE 1-2V*R*
2 series · 2 of 2 positions shown · non-contrast
Comparison: None.

CLINICAL DATA: Bilateral knee pain for 8 months

EXAM:
LEFT KNEE - 1-2 VIEW; RIGHT KNEE - 1-2 VIEW

[knee ap]
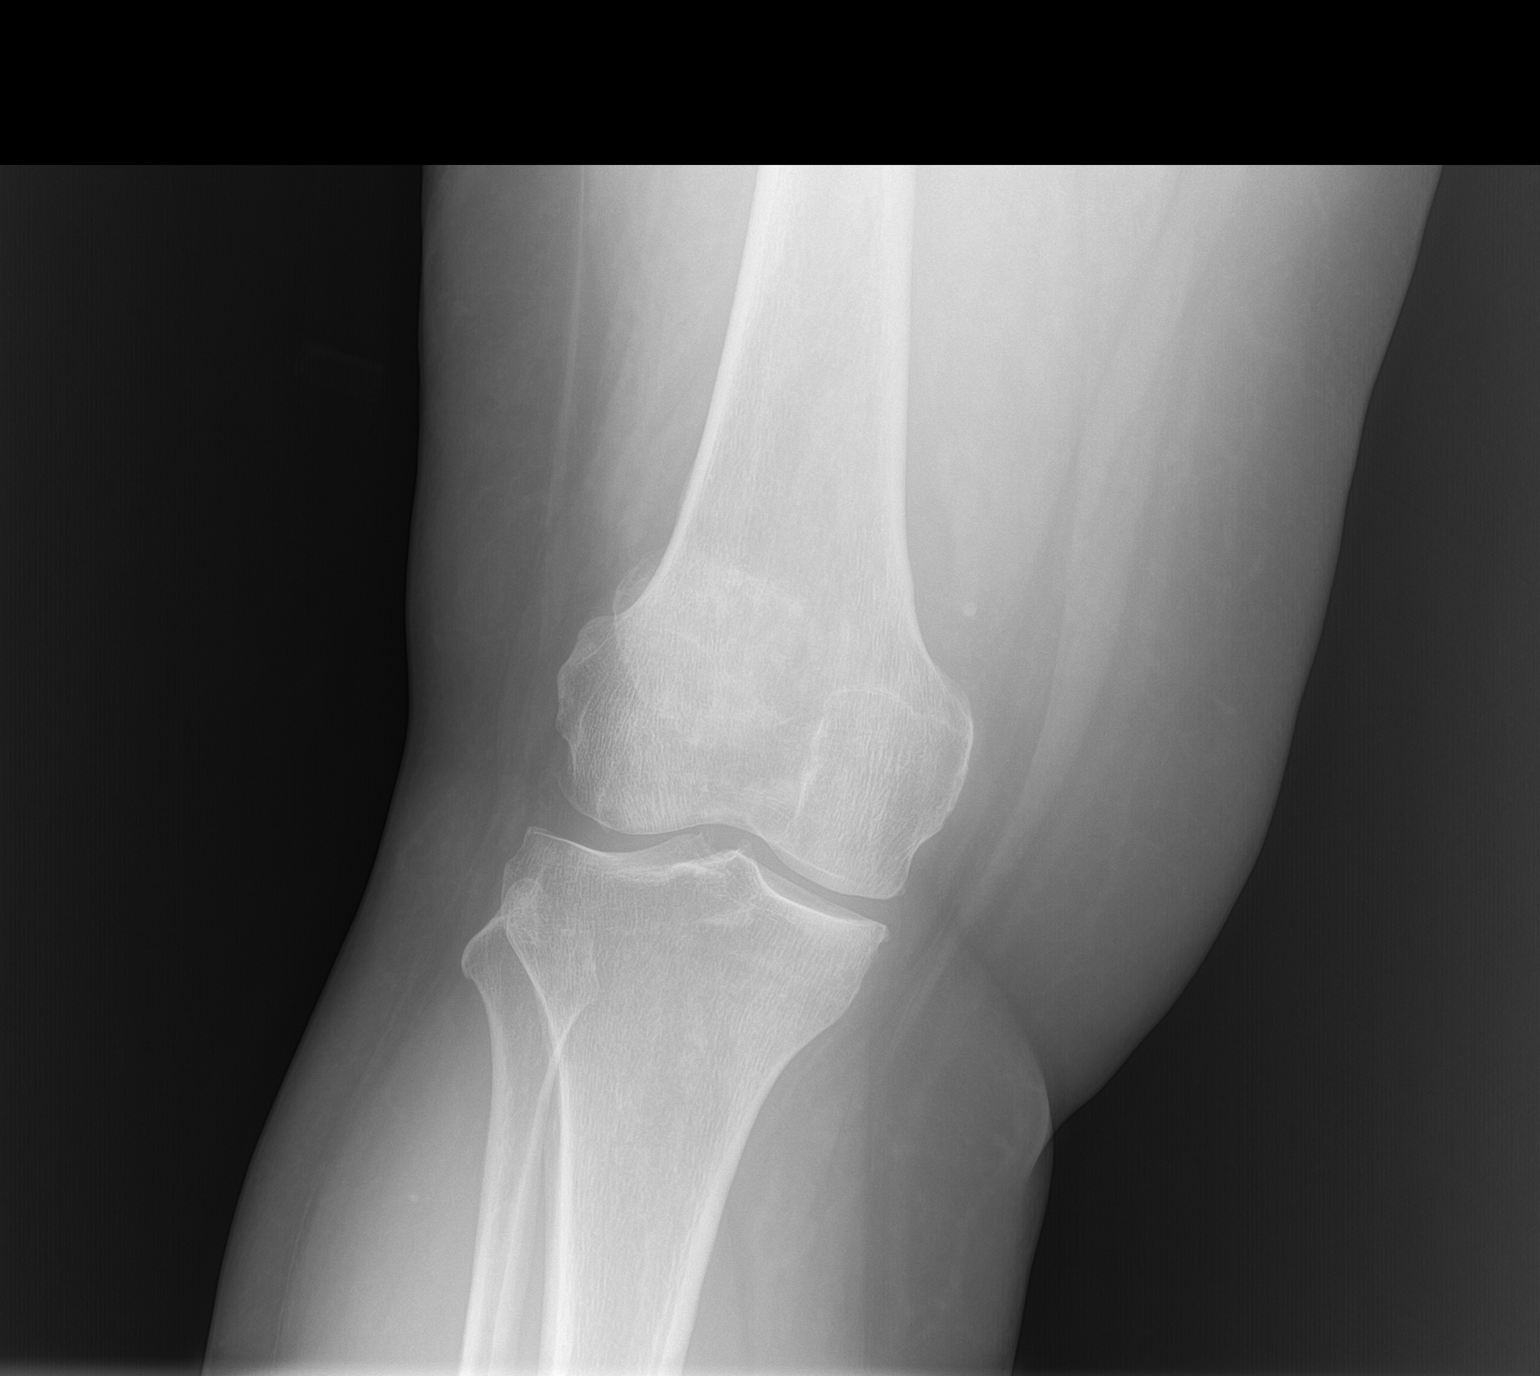

[knee lat]
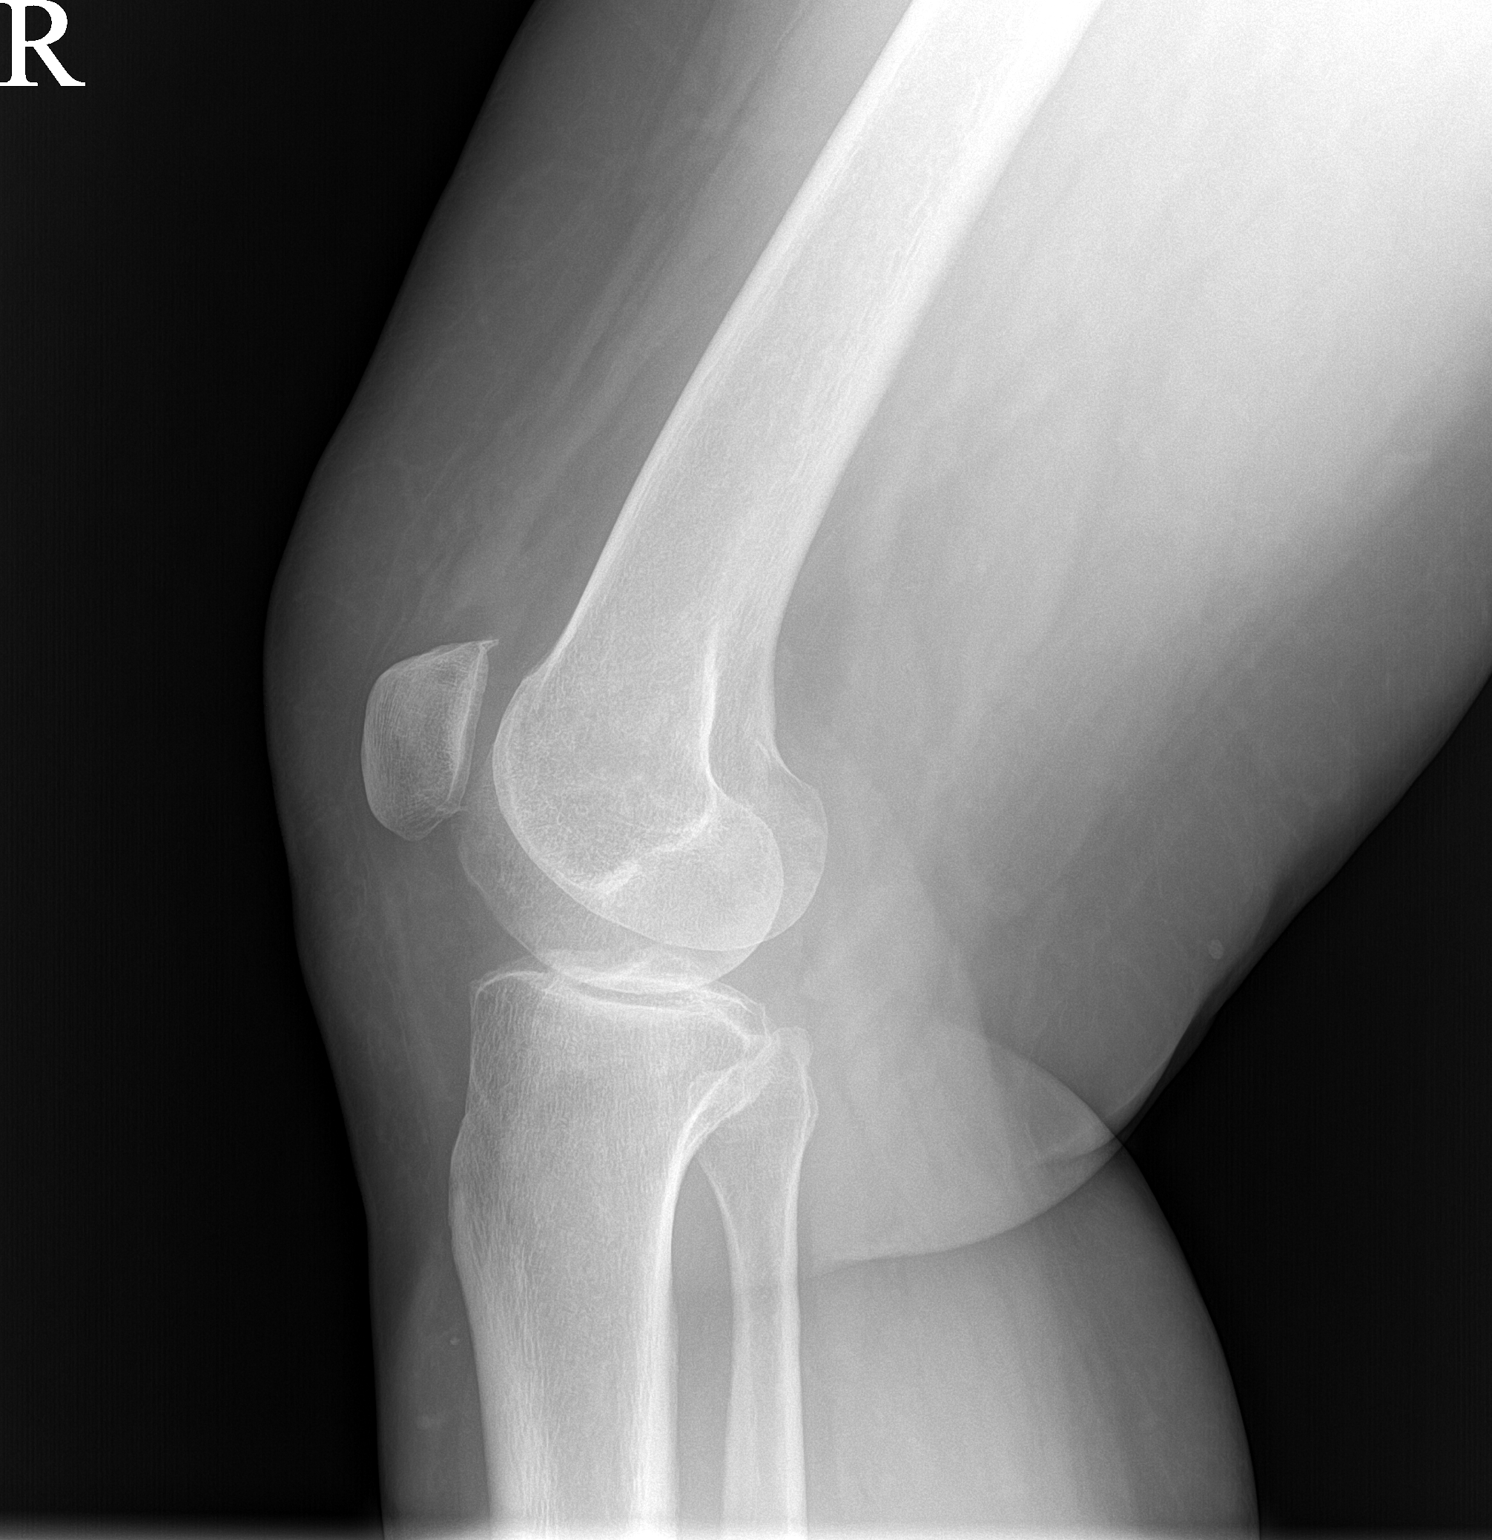

[2 of 2 positions shown; findings below may reference images not displayed]

FINDINGS: No acute fracture or malalignment of the bilateral knees. Mild
tricompartmental joint space narrowing of both knees. Trace knee
joint effusions bilaterally, nonspecific. No focal soft tissue
swelling.
IMPRESSION: Mild tricompartmental joint space narrowing of both knees.

## 2023-02-07 ENCOUNTER — Other Ambulatory Visit: Payer: Self-pay | Admitting: Family Medicine

## 2023-02-07 DIAGNOSIS — Z1231 Encounter for screening mammogram for malignant neoplasm of breast: Secondary | ICD-10-CM

## 2023-03-10 ENCOUNTER — Ambulatory Visit
Admission: RE | Admit: 2023-03-10 | Discharge: 2023-03-10 | Disposition: A | Discharge status: Home / Self Care | Payer: Commercial Managed Care - PPO | Source: Ambulatory Visit | Attending: Family Medicine | Admitting: Family Medicine

## 2023-03-10 DIAGNOSIS — Z1231 Encounter for screening mammogram for malignant neoplasm of breast: Secondary | ICD-10-CM

## 2024-02-24 ENCOUNTER — Other Ambulatory Visit: Payer: Self-pay | Admitting: Family Medicine

## 2024-02-24 DIAGNOSIS — Z1231 Encounter for screening mammogram for malignant neoplasm of breast: Secondary | ICD-10-CM

## 2024-03-12 ENCOUNTER — Ambulatory Visit
Admission: RE | Admit: 2024-03-12 | Discharge: 2024-03-12 | Disposition: A | Discharge status: Home / Self Care | Source: Ambulatory Visit | Attending: Family Medicine | Admitting: Family Medicine

## 2024-03-12 DIAGNOSIS — Z1231 Encounter for screening mammogram for malignant neoplasm of breast: Secondary | ICD-10-CM
